# Patient Record
Sex: Male | Born: 1960 | Race: Black or African American | Hispanic: No | Marital: Single | Smoking: Former smoker
Health system: Southern US, Community
[De-identification: ages and names within clinical notes are randomized; demographics above are authoritative.]

## PROBLEM LIST (undated history)

## (undated) DIAGNOSIS — R42 Dizziness and giddiness: Secondary | ICD-10-CM

## (undated) DIAGNOSIS — R011 Cardiac murmur, unspecified: Secondary | ICD-10-CM

## (undated) DIAGNOSIS — Z972 Presence of dental prosthetic device (complete) (partial): Secondary | ICD-10-CM

## (undated) DIAGNOSIS — F259 Schizoaffective disorder, unspecified: Secondary | ICD-10-CM

## (undated) DIAGNOSIS — R634 Abnormal weight loss: Secondary | ICD-10-CM

## (undated) DIAGNOSIS — F419 Anxiety disorder, unspecified: Secondary | ICD-10-CM

## (undated) DIAGNOSIS — Z973 Presence of spectacles and contact lenses: Secondary | ICD-10-CM

## (undated) DIAGNOSIS — R188 Other ascites: Secondary | ICD-10-CM

## (undated) DIAGNOSIS — K08109 Complete loss of teeth, unspecified cause, unspecified class: Secondary | ICD-10-CM

## (undated) HISTORY — PX: BACK SURGERY: SHX140

## (undated) HISTORY — PX: NECK SURGERY: SHX720

## (undated) HISTORY — DX: Cardiac murmur, unspecified: R01.1

## (undated) HISTORY — PX: TONSILLECTOMY: SUR1361

---

## 1980-05-17 HISTORY — PX: THROAT SURGERY: SHX803

## 2008-01-25 ENCOUNTER — Emergency Department: Payer: Self-pay | Admitting: Emergency Medicine

## 2008-01-27 ENCOUNTER — Emergency Department: Payer: Self-pay | Admitting: Emergency Medicine

## 2010-11-16 ENCOUNTER — Observation Stay: Payer: Self-pay | Admitting: Internal Medicine

## 2010-11-17 ENCOUNTER — Inpatient Hospital Stay: Payer: Self-pay | Admitting: Unknown Physician Specialty

## 2010-11-17 DIAGNOSIS — R079 Chest pain, unspecified: Secondary | ICD-10-CM

## 2013-09-25 ENCOUNTER — Encounter (HOSPITAL_BASED_OUTPATIENT_CLINIC_OR_DEPARTMENT_OTHER): Payer: Self-pay | Admitting: Emergency Medicine

## 2013-09-25 ENCOUNTER — Emergency Department (HOSPITAL_BASED_OUTPATIENT_CLINIC_OR_DEPARTMENT_OTHER)
Admission: EM | Admit: 2013-09-25 | Discharge: 2013-09-25 | Disposition: A | Discharge status: Home / Self Care | Payer: Medicare Other | Attending: Emergency Medicine | Admitting: Emergency Medicine

## 2013-09-25 DIAGNOSIS — Z79899 Other long term (current) drug therapy: Secondary | ICD-10-CM | POA: Insufficient documentation

## 2013-09-25 DIAGNOSIS — F17200 Nicotine dependence, unspecified, uncomplicated: Secondary | ICD-10-CM | POA: Insufficient documentation

## 2013-09-25 DIAGNOSIS — K4090 Unilateral inguinal hernia, without obstruction or gangrene, not specified as recurrent: Secondary | ICD-10-CM

## 2013-09-25 DIAGNOSIS — F259 Schizoaffective disorder, unspecified: Secondary | ICD-10-CM | POA: Insufficient documentation

## 2013-09-25 HISTORY — DX: Schizoaffective disorder, unspecified: F25.9

## 2013-09-25 HISTORY — DX: Dizziness and giddiness: R42

## 2013-09-25 HISTORY — DX: Anxiety disorder, unspecified: F41.9

## 2013-09-25 MED ORDER — HYDROCODONE-ACETAMINOPHEN 5-325 MG PO TABS: 2.0000 | ORAL_TABLET | ORAL | Status: DC | PRN

## 2013-09-25 NOTE — ED Provider Notes (Signed)
CSN: [id number]     Arrival date & time 09/25/13  1535 History   First MD Initiated Contact with Patient 09/25/13 1601     Chief Complaint  Patient presents with  . Abdominal Pain     (Consider location/radiation/quality/duration/timing/severity/associated sxs/prior Treatment) Patient is a 53 y.o. male presenting with abdominal pain. The history is provided by the patient. No language interpreter was used.  Abdominal Pain Pain location:  LLQ Pain quality: aching   Pain radiates to:  LLQ Pain severity:  Moderate Onset quality:  Unable to specify Duration:  1 day Timing:  Constant Progression:  Worsening Chronicity:  New Relieved by:  Nothing Worsened by:  Nothing tried Ineffective treatments:  None tried Associated symptoms: no nausea   Risk factors: has not had multiple surgeries     Past Medical History  Diagnosis Date  . Vertigo   . Schizoaffective disorder   . Anxiety    History reviewed. No pertinent past surgical history. History reviewed. No pertinent family history. History  Substance Use Topics  . Smoking status: Current Every Day Smoker -- 0.30 packs/day    Types: Cigarettes  . Smokeless tobacco: Not on file  . Alcohol Use: Yes     Comment: socially    Review of Systems  Gastrointestinal: Positive for abdominal pain. Negative for nausea.  All other systems reviewed and are negative.     Allergies  Review of patient's allergies indicates no known allergies.  Home Medications   Prior to Admission medications   Medication Sig Start Date End Date Taking? Authorizing Provider  ARIPiprazole (ABILIFY) 10 MG tablet Take 10 mg by mouth daily.   Yes Historical Provider, MD  benztropine (COGENTIN) 2 MG tablet Take 2 mg by mouth 2 (two) times daily.   Yes Historical Provider, MD  divalproex (DEPAKOTE) 500 MG DR tablet Take 500 mg by mouth 2 (two) times daily.   Yes Historical Provider, MD   BP 129/85  Pulse 58  Temp(Src) 98.9 F (37.2 C) (Oral)  Resp 18   Ht 6' (1.829 m)  Wt 150 lb (68.04 kg)  BMI 20.34 kg/m2  SpO2 100% Physical Exam  Nursing note and vitals reviewed. Constitutional: He is oriented to person, place, and time. He appears well-developed and well-nourished.  HENT:  Head: Normocephalic.  Eyes: EOM are normal.  Neck: Normal range of motion.  Pulmonary/Chest: Effort normal.  Abdominal: He exhibits no distension.  Hernia left inguinal area,  Reduces easily,   Musculoskeletal: Normal range of motion.  Neurological: He is alert and oriented to person, place, and time.  Psychiatric: He has a normal mood and affect.    ED Course  Procedures (including critical care time) Labs Review Labs Reviewed  URINALYSIS, ROUTINE W REFLEX MICROSCOPIC    Imaging Review No results found.   EKG Interpretation None      MDM   Final diagnoses:  Hernia, inguinal, left    Dr. Lynelle DoctorKnapp in to see and examine.  [facility] referred [facility]    Benjamin AreasLeslie K Sofia, PA-C 09/25/13 1650

## 2013-09-25 NOTE — ED Provider Notes (Signed)
Medical screening examination/treatment/procedure(s) were conducted as a shared visit with non-physician practitioner(s) and myself.  I personally evaluated the patient during the encounter.  Left inguinal hernia.  Easily reduced.  No sign of incarceration.    Kourtney Terriquez R Dayrin Stallone,Celene Kras MD 09/25/13 [phone]

## 2013-09-25 NOTE — ED Notes (Signed)
Pt reports LLQ pain x 1 week. Deneis fever, N/V.

## 2013-09-25 NOTE — Discharge Instructions (Signed)
Hernia A hernia occurs when an internal organ pushes out through a weak spot in the abdominal wall. Hernias most commonly occur in the groin and around the navel. Hernias often can be pushed back into place (reduced). Most hernias tend to get worse over time. Some abdominal hernias can get stuck in the opening (irreducible or incarcerated hernia) and cannot be reduced. An irreducible abdominal hernia which is tightly squeezed into the opening is at risk for impaired blood supply (strangulated hernia). A strangulated hernia is a medical emergency. Because of the risk for an irreducible or strangulated hernia, surgery may be recommended to repair a hernia. CAUSES   Heavy lifting.  Prolonged coughing.  Straining to have a bowel movement.  A cut (incision) made during an abdominal surgery. HOME CARE INSTRUCTIONS   Bed rest is not required. You may continue your normal activities.  Avoid lifting more than 10 pounds (4.5 kg) or straining.  Cough gently. If you are a smoker it is best to stop. Even the best hernia repair can break down with the continual strain of coughing. Even if you do not have your hernia repaired, a cough will continue to aggravate the problem.  Do not wear anything tight over your hernia. Do not try to keep it in with an outside bandage or truss. These can damage abdominal contents if they are trapped within the hernia sac.  Eat a normal diet.  Avoid constipation. Straining over long periods of time will increase hernia size and encourage breakdown of repairs. If you cannot do this with diet alone, stool softeners may be used. SEEK IMMEDIATE MEDICAL CARE IF:   You have a fever.  You develop increasing abdominal pain.  You feel nauseous or vomit.  Your hernia is stuck outside the abdomen, looks discolored, feels hard, or is tender.  You have any changes in your bowel habits or in the hernia that are unusual for you.  You have increased pain or swelling around the  hernia.  You cannot push the hernia back in place by applying gentle pressure while lying down. MAKE SURE YOU:   Understand these instructions.  Will watch your condition.  Will get help right away if you are not doing well or get worse. Document Released: 05/03/2005 Document Revised: 07/26/2011 Document Reviewed: 12/21/2007 ExitCare Patient Information 2014 [employer].  

## 2013-10-09 ENCOUNTER — Ambulatory Visit (INDEPENDENT_AMBULATORY_CARE_PROVIDER_SITE_OTHER): Payer: Medicare Other | Admitting: Surgery

## 2013-10-09 ENCOUNTER — Encounter (INDEPENDENT_AMBULATORY_CARE_PROVIDER_SITE_OTHER): Payer: Self-pay | Admitting: Surgery

## 2013-10-09 VITALS — BP 122/80 | HR 70 | Temp 97.1°F | Ht 72.0 in | Wt 133.0 lb

## 2013-10-09 DIAGNOSIS — R599 Enlarged lymph nodes, unspecified: Secondary | ICD-10-CM

## 2013-10-09 DIAGNOSIS — R109 Unspecified abdominal pain: Secondary | ICD-10-CM

## 2013-10-09 DIAGNOSIS — R634 Abnormal weight loss: Secondary | ICD-10-CM

## 2013-10-09 DIAGNOSIS — K4090 Unilateral inguinal hernia, without obstruction or gangrene, not specified as recurrent: Secondary | ICD-10-CM

## 2013-10-09 DIAGNOSIS — R590 Localized enlarged lymph nodes: Secondary | ICD-10-CM | POA: Insufficient documentation

## 2013-10-09 MED ORDER — HYDROCODONE-ACETAMINOPHEN 5-325 MG PO TABS: 2.0000 | ORAL_TABLET | ORAL | Status: DC | PRN

## 2013-10-09 NOTE — Patient Instructions (Signed)
WILL SET UP TESTS TO EVALUATE WEIGHT LOSS AND ABDOMINAL PAIN AND ENLARGED LYMPH NODES.      Inguinal Hernia, Adult Muscles help keep everything in the body in its proper place. But if a weak spot in the muscles develops, something can poke through. That is called a hernia. When this happens in the lower part of the belly (abdomen), it is called an inguinal hernia. (It takes its name from a part of the body in this region called the inguinal canal.) A weak spot in the wall of muscles lets some fat or part of the small intestine bulge through. An inguinal hernia can develop at any age. Men get them more often than women. CAUSES  In adults, an inguinal hernia develops over time.  It can be triggered by:  Suddenly straining the muscles of the lower abdomen.  Lifting heavy objects.  Straining to have a bowel movement. Difficult bowel movements (constipation) can lead to this.  Constant coughing. This may be caused by smoking or lung disease.  Being overweight.  Being pregnant.  Working at a job that requires long periods of standing or heavy lifting.  Having had an inguinal hernia before. One type can be an emergency situation. It is called a strangulated inguinal hernia. It develops if part of the small intestine slips through the weak spot and cannot get back into the abdomen. The blood supply can be cut off. If that happens, part of the intestine may die. This situation requires emergency surgery. SYMPTOMS  Often, a small inguinal hernia has no symptoms. It is found when a healthcare provider does a physical exam. Larger hernias usually have symptoms.   In adults, symptoms may include:  A lump in the groin. This is easier to see when the person is standing. It might disappear when lying down.  In men, a lump in the scrotum.  Pain or burning in the groin. This occurs especially when lifting, straining or coughing.  A dull ache or feeling of pressure in the groin.  Signs of a  strangulated hernia can include:  A bulge in the groin that becomes very painful and tender to the touch.  A bulge that turns red or purple.  Fever, nausea and vomiting.  Inability to have a bowel movement or to pass gas. DIAGNOSIS  To decide if you have an inguinal hernia, a healthcare provider will probably do a physical examination.  This will include asking questions about any symptoms you have noticed.  The healthcare provider might feel the groin area and ask you to cough. If an inguinal hernia is felt, the healthcare provider may try to slide it back into the abdomen.  Usually no other tests are needed. TREATMENT  Treatments can vary. The size of the hernia makes a difference. Options include:  Watchful waiting. This is often suggested if the hernia is small and you have had no symptoms.  No medical procedure will be done unless symptoms develop.  You will need to watch closely for symptoms. If any occur, contact your healthcare provider right away.  Surgery. This is used if the hernia is larger or you have symptoms.  Open surgery. This is usually an outpatient procedure (you will not stay overnight in a hospital). An cut (incision) is made through the skin in the groin. The hernia is put back inside the abdomen. The weak area in the muscles is then repaired by herniorrhaphy or hernioplasty. Herniorrhaphy: in this type of surgery, the weak muscles are sewn  back together. Hernioplasty: a patch or mesh is used to close the weak area in the abdominal wall.  Laparoscopy. In this procedure, a surgeon makes small incisions. A thin tube with a tiny video camera (called a laparoscope) is put into the abdomen. The surgeon repairs the hernia with mesh by looking with the video camera and using two long instruments. HOME CARE INSTRUCTIONS   After surgery to repair an inguinal hernia:  You will need to take pain medicine prescribed by your healthcare provider. Follow all directions  carefully.  You will need to take care of the wound from the incision.  Your activity will be restricted for awhile. This will probably include no heavy lifting for several weeks. You also should not do anything too active for a few weeks. When you can return to work will depend on the type of job that you have.  During "watchful waiting" periods, you should:  Maintain a healthy weight.  Eat a diet high in fiber (fruits, vegetables and whole grains).  Drink plenty of fluids to avoid constipation. This means drinking enough water and other liquids to keep your urine clear or pale yellow.  Do not lift heavy objects.  Do not stand for long periods of time.  Quit smoking. This should keep you from developing a frequent cough. SEEK MEDICAL CARE IF:   A bulge develops in your groin area.  You feel pain, a burning sensation or pressure in the groin. This might be worse if you are lifting or straining.  You develop a fever of more than 100.5 F (38.1 C). SEEK IMMEDIATE MEDICAL CARE IF:   Pain in the groin increases suddenly.  A bulge in the groin gets bigger suddenly and does not go down.  For men, there is sudden pain in the scrotum. Or, the size of the scrotum increases.  A bulge in the groin area becomes red or purple and is painful to touch.  You have nausea or vomiting that does not go away.  You feel your heart beating much faster than normal.  You cannot have a bowel movement or pass gas.  You develop a fever of more than 102.0 F (38.9 C). Document Released: 09/19/2008 Document Revised: 07/26/2011 Document Reviewed: 09/19/2008 [facility] Patient Information 2014 [place], [place].

## 2013-10-09 NOTE — Progress Notes (Signed)
Patient ID: Benjamin Salazar, male   DOB: 20-Jan-1961, 53 y.o.   MRN: [id number]  Chief Complaint  Patient presents with  . eval lih    HPI Benjamin Salazar is a 53 y.o. male.   HPI The patient presents with chief complaint of left inguinal hernia, weight loss of 20 pounds over last month, and mas under right jaw. The patient was seen recently in the emergency room and referred for small left inguinal hernia. He complains of diffuse abdominal pain and fullness. He did having some issue with his bowel movements. He has some constipation and irritability. He has no primary care doctor. He is on anti seizure medication but apparently not followed by any one closely. He states he has a knot under his right jaw. He is a heavy smoker. He is not consistently seeing a doctor. Denies night sweats. Has global pain in upper and lower extremities as well abdomen and neck.   Past Medical History  Diagnosis Date  . Vertigo   . Schizoaffective disorder   . Anxiety   . Heart murmur     History reviewed. No pertinent past surgical history.  History reviewed. No pertinent family history.  Social History History  Substance Use Topics  . Smoking status: Current Every Day Smoker -- 0.30 packs/day    Types: Cigarettes  . Smokeless tobacco: Not on file  . Alcohol Use: Yes     Comment: socially    No Known Allergies  Current Outpatient Prescriptions  Medication Sig Dispense Refill  . ARIPiprazole (ABILIFY) 10 MG tablet Take 10 mg by mouth daily.      . benztropine (COGENTIN) 2 MG tablet Take 2 mg by mouth 2 (two) times daily.      . divalproex (DEPAKOTE) 500 MG DR tablet Take 500 mg by mouth 2 (two) times daily.      Marland Kitchen HYDROcodone-acetaminophen (NORCO/VICODIN) 5-325 MG per tablet Take 2 tablets by mouth every 4 (four) hours as needed.  20 tablet  0   No current facility-administered medications for this visit.    Review of Systems Review of Systems  Constitutional: Positive for fever,  fatigue and unexpected weight change.  HENT: Negative.   Eyes: Negative.   Respiratory: Negative for cough.   Gastrointestinal: Positive for abdominal pain, constipation and abdominal distention. Negative for vomiting.  Genitourinary: Positive for testicular pain.  Musculoskeletal: Positive for arthralgias, back pain, myalgias and neck pain.  Neurological: Positive for seizures and weakness.  Hematological: Positive for adenopathy.  Psychiatric/Behavioral: The patient is nervous/anxious.     Blood pressure 122/80, pulse 70, temperature 97.1 F (36.2 C), height 6' (1.829 m), weight 133 lb (60.328 kg).  Physical Exam Physical Exam  Constitutional: He is oriented to person, place, and time. He has a sickly appearance.  HENT:  Head: Normocephalic and atraumatic.  Eyes: Pupils are equal, round, and reactive to light. No scleral icterus.  Neck: Normal range of motion. No thyromegaly present.  Cardiovascular: Normal rate and regular rhythm.   Pulmonary/Chest: Effort normal and breath sounds normal.  Abdominal: Soft. He exhibits distension and ascites. He exhibits no mass. There is no hepatosplenomegaly. There is generalized tenderness. There is no rigidity, no rebound, no guarding and no tenderness at McBurney's point. A hernia is present. Hernia confirmed positive in the left inguinal area.    Lymphadenopathy:       Head (right side): Submental adenopathy present.    He has cervical adenopathy.    He has axillary adenopathy.  Right axillary: Lateral adenopathy present.       Left axillary: Lateral adenopathy present.       Right: Inguinal adenopathy present.       Left: Inguinal adenopathy present.  Neurological: He is alert and oriented to person, place, and time.  Skin: Skin is warm and dry.  Psychiatric: He has a normal mood and affect. His behavior is normal. Thought content normal.    Data Reviewed ED note  Assessment    Small left reducible inguinal hernia  Right  cervical lymphadenopathy  Abdominal pain generalized nonspecific  Bilateral axillary lymphadenopathy  Unexplained weight loss  Tobacco abuse    Plan    Patient has significant weight loss, lymphadenopathy and overall CT appearance. Left inguinal hernia is not responsible for this.Given patient's extensive tobacco history, recommend cervical CT, chest x-ray, abdominal pelvic CT, CBC, C. Met and LDH level. His workup otherwise negative, recommend repair of left inguinal hernia. Recommend tobacco cessation.       Maxim Bedel A. Kharma Sampsel 10/09/2013, 3:57 PM

## 2013-10-10 ENCOUNTER — Telehealth (INDEPENDENT_AMBULATORY_CARE_PROVIDER_SITE_OTHER): Payer: Self-pay | Admitting: *Deleted

## 2013-10-10 NOTE — Telephone Encounter (Signed)
LM for pt to return my call regarding his CT.  It is scheduled for 10/11/13, [facility], [address].  Arrive at 8:15 a.m.   Drink 1st bottle of contrast 6:30 a.m. And 2nd bottle 7:30 a.m. NO solid foods 4 hours prior to test.  Thanks!  Victorino Dike

## 2013-10-10 NOTE — Telephone Encounter (Signed)
Patient  Aware of CT Date /time ,NPO, drink times, Gave # [phone] if further questions

## 2013-10-11 ENCOUNTER — Ambulatory Visit
Admission: RE | Admit: 2013-10-11 | Discharge: 2013-10-11 | Disposition: A | Discharge status: Home / Self Care | Payer: Medicare Other | Source: Ambulatory Visit | Attending: Surgery | Admitting: Surgery

## 2013-10-11 DIAGNOSIS — R109 Unspecified abdominal pain: Secondary | ICD-10-CM

## 2013-10-11 MED ORDER — IOHEXOL 300 MG/ML  SOLN
100.0000 mL | Freq: Once | INTRAMUSCULAR | Status: AC | PRN
  Administered 2013-10-11: 100 mL via INTRAVENOUS

## 2013-10-17 ENCOUNTER — Telehealth (INDEPENDENT_AMBULATORY_CARE_PROVIDER_SITE_OTHER): Payer: Self-pay

## 2013-10-17 ENCOUNTER — Other Ambulatory Visit (INDEPENDENT_AMBULATORY_CARE_PROVIDER_SITE_OTHER): Payer: Self-pay

## 2013-10-17 ENCOUNTER — Other Ambulatory Visit (INDEPENDENT_AMBULATORY_CARE_PROVIDER_SITE_OTHER): Payer: Self-pay | Admitting: Surgery

## 2013-10-17 DIAGNOSIS — K869 Disease of pancreas, unspecified: Secondary | ICD-10-CM

## 2013-10-17 DIAGNOSIS — Z139 Encounter for screening, unspecified: Secondary | ICD-10-CM

## 2013-10-17 NOTE — Telephone Encounter (Signed)
Message copied by Brennan Bailey on Wed Oct 17, 2013  1:29 PM ------      Message from: Harriette Bouillon A      Created: Fri Oct 12, 2013  9:35 AM       Needs pancreatic MRI.  If ok,  Will set up hernia surgery. ------

## 2013-10-17 NOTE — Telephone Encounter (Signed)
Called pt with results. We will call him this afternoon with MRI appt,

## 2013-10-18 ENCOUNTER — Ambulatory Visit
Admission: RE | Admit: 2013-10-18 | Discharge: 2013-10-18 | Disposition: A | Discharge status: Home / Self Care | Payer: Medicare Other | Source: Ambulatory Visit | Attending: Surgery | Admitting: Surgery

## 2013-10-18 DIAGNOSIS — Z139 Encounter for screening, unspecified: Secondary | ICD-10-CM

## 2013-10-22 ENCOUNTER — Ambulatory Visit
Admission: RE | Admit: 2013-10-22 | Discharge: 2013-10-22 | Disposition: A | Discharge status: Home / Self Care | Payer: Medicare Other | Source: Ambulatory Visit | Attending: Surgery | Admitting: Surgery

## 2013-10-22 DIAGNOSIS — K869 Disease of pancreas, unspecified: Secondary | ICD-10-CM

## 2013-10-22 MED ORDER — GADOBENATE DIMEGLUMINE 529 MG/ML IV SOLN
12.0000 mL | Freq: Once | INTRAVENOUS | Status: AC | PRN
  Administered 2013-10-22: 12 mL via INTRAVENOUS

## 2013-10-23 ENCOUNTER — Telehealth (INDEPENDENT_AMBULATORY_CARE_PROVIDER_SITE_OTHER): Payer: Self-pay | Admitting: *Deleted

## 2013-10-23 NOTE — Telephone Encounter (Signed)
Dr. Janae Sauce, Psychiatrist in [facility], called this morning and would like to speak with you regarding pt.  She advised that it was not urgent, but when you have some free time to please call her at [phone].  Thanks!  Victorino Dike

## 2013-10-25 LAB — COMPREHENSIVE METABOLIC PANEL
ALK PHOS: 53 U/L (ref 39–117)
ALT: 16 U/L (ref 0–53)
AST: 20 U/L (ref 0–37)
Albumin: 4.8 g/dL (ref 3.5–5.2)
BUN: 9 mg/dL (ref 6–23)
CALCIUM: 10.1 mg/dL (ref 8.4–10.5)
CHLORIDE: 105 meq/L (ref 96–112)
CO2: 24 meq/L (ref 19–32)
Creat: 1.04 mg/dL (ref 0.50–1.35)
GLUCOSE: 111 mg/dL — AB (ref 70–99)
POTASSIUM: 4.3 meq/L (ref 3.5–5.3)
Sodium: 142 mEq/L (ref 135–145)
TOTAL PROTEIN: 8 g/dL (ref 6.0–8.3)
Total Bilirubin: 0.5 mg/dL (ref 0.2–1.2)

## 2013-10-25 LAB — LACTATE DEHYDROGENASE: LDH: 160 U/L (ref 94–250)

## 2013-10-29 ENCOUNTER — Encounter (INDEPENDENT_AMBULATORY_CARE_PROVIDER_SITE_OTHER): Payer: Self-pay | Admitting: Surgery

## 2013-10-29 ENCOUNTER — Ambulatory Visit (INDEPENDENT_AMBULATORY_CARE_PROVIDER_SITE_OTHER): Payer: Medicare Other | Admitting: Surgery

## 2013-10-29 VITALS — BP 114/70 | HR 60 | Resp 12 | Ht 72.0 in | Wt 134.0 lb

## 2013-10-29 DIAGNOSIS — K4090 Unilateral inguinal hernia, without obstruction or gangrene, not specified as recurrent: Secondary | ICD-10-CM

## 2013-10-29 MED ORDER — HYDROCODONE-ACETAMINOPHEN 5-325 MG PO TABS: 1.0000 | ORAL_TABLET | ORAL | Status: DC | PRN

## 2013-10-29 NOTE — Progress Notes (Signed)
Subjective:     Patient ID: Benjamin Salazar, male   DOB: 07-26-1960, 53 y.o.   MRN: [id number]  HPI Patient returns in follow up secondary to abdominal pain, small painful left inguinal hernia, weight loss and lymphadenopathy. CT scanning of the neck revealed no lymphadenopathy. CT abdomen and pelvis showed a possible pancreatic mass that was imaged by MRI and found to be normal. No explanation for weight loss, abdominal pain or fatigue found. Of note, left inguinal hernia not well visualized. No pelvic adenopathy either. Here today to set up surgery for repair of symptomatic left anal hernia.  Review of Systems  Constitutional: Positive for fatigue.  Gastrointestinal: Positive for abdominal pain.  Genitourinary: Negative.        Objective:   Physical Exam  Constitutional: He is oriented to person, place, and time.  HENT:  Head: Normocephalic.  Eyes: No scleral icterus.  Abdominal: A hernia is present. Hernia confirmed positive in the left inguinal area.  Neurological: He is alert and oriented to person, place, and time.  Psychiatric: He has a normal mood and affect. His behavior is normal. Judgment and thought content normal.       Assessment:     Left inguinal  hernia symptomatic  Weight loss unclear etiology  Tobacco abuse      Plan:     Given negative workup for malignancy, will proceed with left inguinal hernia with mesh.The risk of hernia repair include bleeding,  Infection,   Recurrence of the hernia,  Mesh use, chronic pain,  Organ injury,  Bowel injury,  Bladder injury,   nerve injury with numbness around the incision,  Death,  and worsening of preexisting  medical problems.  The alternatives to surgery have been discussed as well..  Long term expectations of both operative and non operative treatments have been discussed.   The patient agrees to proceed.

## 2013-10-29 NOTE — Patient Instructions (Signed)
Open Hernia Repair Open hernia repair is surgery to fix a hernia. A hernia occurs when an internal organ or tissue pushes out through a weak spot in the abdominal wall muscles. Hernias commonly occur in the groin and around the navel. Most hernias tend to get worse over time. Surgery is often done to prevent the hernia from getting bigger, becoming uncomfortable, or becoming an emergency. Emergency surgery may be needed if abdominal contents get stuck in the opening (incarcerated hernia) or the blood supply gets cut off (strangulated hernia). In an open repair, a large cut (incision) is made in the abdomen to perform the surgery. LET YOUR HEALTH CARE PROVIDER KNOW ABOUT:  Any allergies you have.  All medicines you are taking, including vitamins, herbs, eye drops, creams, and over-the-counter medicines.  Previous problems you or members of your family have had with the use of anesthetics.  Any blood disorders you have.  Previous surgeries you have had.  Medical conditions you have. RISKS AND COMPLICATIONS Generally, this is a safe procedure. However, as with any procedure, complications can occur. Possible complications include:  Infection.  Bleeding.  Nerve injury.  Chronic pain.  The hernia can come back.  Injury to the intestines. BEFORE THE PROCEDURE  Ask your health care provider about changing or stopping any regular medicines. Avoid taking aspirin or blood thinners as directed by your health care provider.  Do noteat or drink anything after midnight the night before surgery.  If you smoke, do not smoke for at least 2 weeks before your surgery.  Do not drink alcohol the day before your surgery.  Let your health care provider know if you develop a cold or any infection before your surgery.  Arrange for someone to drive you home after the procedure or after your hospital stay. Also arrange for someone to help you with activities during recovery. PROCEDURE   Small  monitors will be put on your body. They are used to check your heart, blood pressure, and oxygen level.   An IV access tube will be put into one of your veins. Medicine will be able to flow directly into your body through this IV tube.   You might be given a medicine to help you relax (sedative).   You will be given a medicine to make you sleep (general anesthetic). A breathing tube may be placed into your lungs during the procedure.  A cut (incision) is made over the hernia defect, and the contents are pushed back into the abdomen.  If the hernia is small, stitches may be used to bring the muscle edges back together.  Typically, a surgeon will place a mesh patch made of man-made material (synthetic) to cover the defect. The mesh is sewn to healthy muscle. This reduces the risk of the hernia coming back.  The tissue and skin over the hernia are then closed with stitches or staples.  If the hernia was large, a drain may be left in place to collect excess fluid where the hernia used to be.  Bandages (dressings) are used to cover the incision. AFTER THE PROCEDURE  You will be taken to a recovery area where your progress will be monitored.  If the hernia was small or in the groin (inguinal) region, you will likely be allowed to go home once you are awake, stable, and taking fluids well.  If the hernia was large, you may have to wait for your bowel function to return. You may need to stay in the hospital   for 2 3 days until you can eat and your pain is controlled. A drain may be left in place for 5 7 days. You will be taught how to care for the drain. Document Released: 10/27/2000 Document Revised: 02/21/2013 Document Reviewed: 12/13/2012 Exit[employer] Patient Information 2014 [employer].  

## 2013-11-12 ENCOUNTER — Other Ambulatory Visit (INDEPENDENT_AMBULATORY_CARE_PROVIDER_SITE_OTHER): Payer: Self-pay | Admitting: Surgery

## 2013-11-12 ENCOUNTER — Encounter (HOSPITAL_BASED_OUTPATIENT_CLINIC_OR_DEPARTMENT_OTHER): Payer: Self-pay | Admitting: *Deleted

## 2013-11-12 NOTE — Progress Notes (Signed)
Pt had xrays and labs to r/o cancer-wt loss States NO History seizures- Take all meds, and bring

## 2013-11-15 ENCOUNTER — Encounter (HOSPITAL_BASED_OUTPATIENT_CLINIC_OR_DEPARTMENT_OTHER): Payer: Medicare Other | Admitting: Anesthesiology

## 2013-11-15 ENCOUNTER — Encounter (HOSPITAL_BASED_OUTPATIENT_CLINIC_OR_DEPARTMENT_OTHER): Payer: Self-pay | Admitting: *Deleted

## 2013-11-15 ENCOUNTER — Encounter (HOSPITAL_BASED_OUTPATIENT_CLINIC_OR_DEPARTMENT_OTHER)
Admission: RE | Disposition: A | Discharge status: Home / Self Care | Payer: Self-pay | Source: Ambulatory Visit | Attending: Surgery

## 2013-11-15 ENCOUNTER — Ambulatory Visit (HOSPITAL_BASED_OUTPATIENT_CLINIC_OR_DEPARTMENT_OTHER)
Admission: RE | Admit: 2013-11-15 | Discharge: 2013-11-15 | Disposition: A | Discharge status: Home / Self Care | Payer: Medicare Other | Source: Ambulatory Visit | Attending: Surgery | Admitting: Surgery

## 2013-11-15 ENCOUNTER — Ambulatory Visit (HOSPITAL_BASED_OUTPATIENT_CLINIC_OR_DEPARTMENT_OTHER): Payer: Medicare Other | Admitting: Anesthesiology

## 2013-11-15 DIAGNOSIS — R590 Localized enlarged lymph nodes: Secondary | ICD-10-CM

## 2013-11-15 DIAGNOSIS — F17200 Nicotine dependence, unspecified, uncomplicated: Secondary | ICD-10-CM | POA: Insufficient documentation

## 2013-11-15 DIAGNOSIS — K4090 Unilateral inguinal hernia, without obstruction or gangrene, not specified as recurrent: Secondary | ICD-10-CM

## 2013-11-15 DIAGNOSIS — R634 Abnormal weight loss: Secondary | ICD-10-CM | POA: Insufficient documentation

## 2013-11-15 DIAGNOSIS — R109 Unspecified abdominal pain: Secondary | ICD-10-CM

## 2013-11-15 HISTORY — DX: Presence of spectacles and contact lenses: Z97.3

## 2013-11-15 HISTORY — PX: INSERTION OF MESH: SHX5868

## 2013-11-15 HISTORY — DX: Other ascites: R18.8

## 2013-11-15 HISTORY — DX: Presence of dental prosthetic device (complete) (partial): Z97.2

## 2013-11-15 HISTORY — PX: INGUINAL HERNIA REPAIR: SHX194

## 2013-11-15 HISTORY — DX: Abnormal weight loss: R63.4

## 2013-11-15 HISTORY — DX: Complete loss of teeth, unspecified cause, unspecified class: K08.109

## 2013-11-15 SURGERY — REPAIR, HERNIA, INGUINAL, ADULT
Anesthesia: General | Site: Groin | Laterality: Left

## 2013-11-15 MED ORDER — BUPIVACAINE-EPINEPHRINE (PF) 0.5% -1:200000 IJ SOLN
INTRAMUSCULAR | Status: DC | PRN
  Administered 2013-11-15: 20 mL via PERINEURAL

## 2013-11-15 MED ORDER — FENTANYL CITRATE 0.05 MG/ML IJ SOLN
50.0000 ug | INTRAMUSCULAR | Status: DC | PRN
  Administered 2013-11-15 (×2): 100 ug via INTRAVENOUS

## 2013-11-15 MED ORDER — FENTANYL CITRATE 0.05 MG/ML IJ SOLN
INTRAMUSCULAR | Status: AC
  Filled 2013-11-15: qty 2

## 2013-11-15 MED ORDER — FENTANYL CITRATE 0.05 MG/ML IJ SOLN
INTRAMUSCULAR | Status: DC | PRN
  Administered 2013-11-15: 100 ug via INTRAVENOUS

## 2013-11-15 MED ORDER — BUPIVACAINE-EPINEPHRINE 0.25% -1:200000 IJ SOLN
INTRAMUSCULAR | Status: DC | PRN
  Administered 2013-11-15: 5 mL

## 2013-11-15 MED ORDER — ONDANSETRON HCL 4 MG/2ML IJ SOLN
INTRAMUSCULAR | Status: DC | PRN
  Administered 2013-11-15: 4 mg via INTRAVENOUS

## 2013-11-15 MED ORDER — PROPOFOL 10 MG/ML IV BOLUS
INTRAVENOUS | Status: AC
  Filled 2013-11-15: qty 20

## 2013-11-15 MED ORDER — CEFAZOLIN SODIUM-DEXTROSE 2-3 GM-% IV SOLR
2.0000 g | INTRAVENOUS | Status: AC
  Administered 2013-11-15: 2 g via INTRAVENOUS

## 2013-11-15 MED ORDER — LIDOCAINE HCL (CARDIAC) 20 MG/ML IV SOLN
INTRAVENOUS | Status: DC | PRN
  Administered 2013-11-15: 80 mg via INTRAVENOUS

## 2013-11-15 MED ORDER — LACTATED RINGERS IV SOLN
INTRAVENOUS | Status: DC
  Administered 2013-11-15 (×2): via INTRAVENOUS

## 2013-11-15 MED ORDER — FENTANYL CITRATE 0.05 MG/ML IJ SOLN
INTRAMUSCULAR | Status: AC
  Filled 2013-11-15: qty 4

## 2013-11-15 MED ORDER — OXYCODONE HCL 5 MG/5ML PO SOLN
ORAL | Status: AC
  Filled 2013-11-15: qty 5

## 2013-11-15 MED ORDER — OXYCODONE HCL 5 MG PO TABS: 5.0000 mg | ORAL_TABLET | Freq: Once | ORAL | Status: AC | PRN

## 2013-11-15 MED ORDER — BUPIVACAINE-EPINEPHRINE (PF) 0.25% -1:200000 IJ SOLN
INTRAMUSCULAR | Status: AC
  Filled 2013-11-15: qty 30

## 2013-11-15 MED ORDER — OXYCODONE HCL 5 MG/5ML PO SOLN
5.0000 mg | Freq: Once | ORAL | Status: AC | PRN
  Administered 2013-11-15: 5 mg via ORAL

## 2013-11-15 MED ORDER — HYDROMORPHONE HCL PF 1 MG/ML IJ SOLN
0.2500 mg | INTRAMUSCULAR | Status: DC | PRN
  Administered 2013-11-15 (×4): 0.5 mg via INTRAVENOUS

## 2013-11-15 MED ORDER — MIDAZOLAM HCL 2 MG/2ML IJ SOLN
1.0000 mg | INTRAMUSCULAR | Status: DC | PRN
  Administered 2013-11-15: 2 mg via INTRAVENOUS

## 2013-11-15 MED ORDER — ONDANSETRON HCL 4 MG/2ML IJ SOLN: 4.0000 mg | Freq: Once | INTRAMUSCULAR | Status: DC | PRN

## 2013-11-15 MED ORDER — HYDROMORPHONE HCL PF 1 MG/ML IJ SOLN
INTRAMUSCULAR | Status: AC
  Filled 2013-11-15: qty 1

## 2013-11-15 MED ORDER — PROPOFOL 10 MG/ML IV BOLUS
INTRAVENOUS | Status: DC | PRN
  Administered 2013-11-15: 200 mg via INTRAVENOUS

## 2013-11-15 MED ORDER — DEXAMETHASONE SODIUM PHOSPHATE 4 MG/ML IJ SOLN
INTRAMUSCULAR | Status: DC | PRN
  Administered 2013-11-15: 10 mg via INTRAVENOUS

## 2013-11-15 MED ORDER — OXYCODONE-ACETAMINOPHEN 10-325 MG PO TABS
1.0000 | ORAL_TABLET | ORAL | Status: DC | PRN
Start: 2013-11-15 — End: 2013-11-28

## 2013-11-15 MED ORDER — MIDAZOLAM HCL 2 MG/2ML IJ SOLN
INTRAMUSCULAR | Status: AC
  Filled 2013-11-15: qty 2

## 2013-11-15 MED ORDER — CEFAZOLIN SODIUM-DEXTROSE 2-3 GM-% IV SOLR
INTRAVENOUS | Status: AC
  Filled 2013-11-15: qty 50

## 2013-11-15 SURGICAL SUPPLY — 52 items
BLADE SURG 15 STRL LF DISP TIS (BLADE) ×2 IMPLANT
BLADE SURG 15 STRL SS (BLADE) ×2
BLADE SURG ROTATE 9660 (MISCELLANEOUS) ×4 IMPLANT
CANISTER SUCT 1200ML W/VALVE (MISCELLANEOUS) ×4 IMPLANT
CHLORAPREP W/TINT 26ML (MISCELLANEOUS) ×4 IMPLANT
COVER MAYO STAND STRL (DRAPES) ×4 IMPLANT
COVER TABLE BACK 60X90 (DRAPES) ×4 IMPLANT
DECANTER SPIKE VIAL GLASS SM (MISCELLANEOUS) ×4 IMPLANT
DERMABOND ADVANCED (GAUZE/BANDAGES/DRESSINGS) ×2
DERMABOND ADVANCED .7 DNX12 (GAUZE/BANDAGES/DRESSINGS) ×2 IMPLANT
DRAIN PENROSE 1/2X12 LTX STRL (WOUND CARE) ×4 IMPLANT
DRAPE LAPAROTOMY TRNSV 102X78 (DRAPE) ×4 IMPLANT
DRAPE UTILITY XL STRL (DRAPES) ×4 IMPLANT
ELECT COATED BLADE 2.86 ST (ELECTRODE) ×4 IMPLANT
ELECT REM PT RETURN 9FT ADLT (ELECTROSURGICAL) ×4
ELECTRODE REM PT RTRN 9FT ADLT (ELECTROSURGICAL) ×2 IMPLANT
GAUZE SPONGE 4X4 16PLY XRAY LF (GAUZE/BANDAGES/DRESSINGS) IMPLANT
GLOVE BIO SURGEON STRL SZ 6.5 (GLOVE) ×3 IMPLANT
GLOVE BIO SURGEONS STRL SZ 6.5 (GLOVE) ×1
GLOVE BIOGEL PI IND STRL 7.0 (GLOVE) ×2 IMPLANT
GLOVE BIOGEL PI IND STRL 8 (GLOVE) ×2 IMPLANT
GLOVE BIOGEL PI INDICATOR 7.0 (GLOVE) ×2
GLOVE BIOGEL PI INDICATOR 8 (GLOVE) ×2
GLOVE ECLIPSE 8.0 STRL XLNG CF (GLOVE) ×4 IMPLANT
GOWN STRL REUS W/ TWL LRG LVL3 (GOWN DISPOSABLE) ×4 IMPLANT
GOWN STRL REUS W/TWL LRG LVL3 (GOWN DISPOSABLE) ×4
MESH HERNIA SYS ULTRAPRO LRG (Mesh General) ×4 IMPLANT
NEEDLE HYPO 25X1 1.5 SAFETY (NEEDLE) ×4 IMPLANT
NS IRRIG 1000ML POUR BTL (IV SOLUTION) ×4 IMPLANT
PACK BASIN DAY SURGERY FS (CUSTOM PROCEDURE TRAY) ×4 IMPLANT
PENCIL BUTTON HOLSTER BLD 10FT (ELECTRODE) ×4 IMPLANT
SLEEVE SCD COMPRESS KNEE MED (MISCELLANEOUS) ×4 IMPLANT
SPONGE GAUZE 4X4 12PLY STER LF (GAUZE/BANDAGES/DRESSINGS) IMPLANT
SPONGE LAP 4X18 X RAY DECT (DISPOSABLE) ×4 IMPLANT
STAPLER VISISTAT 35W (STAPLE) IMPLANT
SUT MON AB 4-0 PC3 18 (SUTURE) ×4 IMPLANT
SUT NOVA 0 T19/GS 22DT (SUTURE) ×8 IMPLANT
SUT VIC AB 0 SH 27 (SUTURE) ×4 IMPLANT
SUT VIC AB 2-0 SH 27 (SUTURE) ×2
SUT VIC AB 2-0 SH 27XBRD (SUTURE) ×2 IMPLANT
SUT VIC AB 3-0 54X BRD REEL (SUTURE) IMPLANT
SUT VIC AB 3-0 BRD 54 (SUTURE)
SUT VICRYL 3-0 CR8 SH (SUTURE) ×4 IMPLANT
SUT VICRYL AB 2 0 TIE (SUTURE) IMPLANT
SUT VICRYL AB 2 0 TIES (SUTURE)
SYRINGE CONTROL L 12CC (SYRINGE) ×4 IMPLANT
TAPE HYPAFIX 4 X10 (GAUZE/BANDAGES/DRESSINGS) IMPLANT
TOWEL OR 17X24 6PK STRL BLUE (TOWEL DISPOSABLE) ×8 IMPLANT
TOWEL OR NON WOVEN STRL DISP B (DISPOSABLE) ×4 IMPLANT
TUBE CONNECTING 20'X1/4 (TUBING) ×1
TUBE CONNECTING 20X1/4 (TUBING) ×3 IMPLANT
YANKAUER SUCT BULB TIP NO VENT (SUCTIONS) ×4 IMPLANT

## 2013-11-15 NOTE — Anesthesia Preprocedure Evaluation (Signed)
Anesthesia Evaluation  Patient identified by MRN, date of birth, ID band Patient awake    Reviewed: Allergy & Precautions, H&P , NPO status , Patient's Chart, lab work & pertinent test results  Airway Mallampati: I TM Distance: >3 FB Neck ROM: Full    Dental  (+) Teeth Intact, Dental Advisory Given   Pulmonary Current Smoker,  breath sounds clear to auscultation        Cardiovascular Rhythm:Regular Rate:Normal     Neuro/Psych    GI/Hepatic   Endo/Other    Renal/GU      Musculoskeletal   Abdominal   Peds  Hematology   Anesthesia Other Findings   Reproductive/Obstetrics                           Anesthesia Physical Anesthesia Plan  ASA: II  Anesthesia Plan: General   Post-op Pain Management:    Induction: Intravenous  Airway Management Planned: LMA  Additional Equipment:   Intra-op Plan:   Post-operative Plan: Extubation in OR  Informed Consent: I have reviewed the patients History and Physical, chart, labs and discussed the procedure including the risks, benefits and alternatives for the proposed anesthesia with the patient or authorized representative who has indicated his/her understanding and acceptance.   Dental advisory given  Plan Discussed with: CRNA, Anesthesiologist and Surgeon  Anesthesia Plan Comments:         Anesthesia Quick Evaluation  

## 2013-11-15 NOTE — Progress Notes (Signed)
Assisted Dr. Crews with left, ultrasound guided, transabdominal plane block. Side rails up, monitors on throughout procedure. See vital signs in flow sheet. Tolerated Procedure well. 

## 2013-11-15 NOTE — Transfer of Care (Signed)
Immediate Anesthesia Transfer of Care Note  Patient: Benjamin Salazar  Procedure(s) Performed: Procedure(s): REPAIR LEFT INGUINAL HERNIA  (Left) INSERTION OF MESH (Left)  Patient Location: PACU  Anesthesia Type:General  Level of Consciousness: awake and alert   Airway & Oxygen Therapy: Patient Spontanous Breathing and Patient connected to face mask oxygen  Post-op Assessment: Report given to PACU RN and Post -op Vital signs reviewed and stable  Post vital signs: Reviewed and stable  Complications: No apparent anesthesia complications

## 2013-11-15 NOTE — H&P (View-Only) (Signed)
Subjective:     Patient ID: Benjamin Salazar, male   DOB: 06/08/1960, 53 y.o.   MRN: [id number]  HPI Patient returns in follow up secondary to abdominal pain, small painful left inguinal hernia, weight loss and lymphadenopathy. CT scanning of the neck revealed no lymphadenopathy. CT abdomen and pelvis showed a possible pancreatic mass that was imaged by MRI and found to be normal. No explanation for weight loss, abdominal pain or fatigue found. Of note, left inguinal hernia not well visualized. No pelvic adenopathy either. Here today to set up surgery for repair of symptomatic left anal hernia.  Review of Systems  Constitutional: Positive for fatigue.  Gastrointestinal: Positive for abdominal pain.  Genitourinary: Negative.        Objective:   Physical Exam  Constitutional: He is oriented to person, place, and time.  HENT:  Head: Normocephalic.  Eyes: No scleral icterus.  Abdominal: A hernia is present. Hernia confirmed positive in the left inguinal area.  Neurological: He is alert and oriented to person, place, and time.  Psychiatric: He has a normal mood and affect. His behavior is normal. Judgment and thought content normal.       Assessment:     Left inguinal  hernia symptomatic  Weight loss unclear etiology  Tobacco abuse      Plan:     Given negative workup for malignancy, will proceed with left inguinal hernia with mesh.The risk of hernia repair include bleeding,  Infection,   Recurrence of the hernia,  Mesh use, chronic pain,  Organ injury,  Bowel injury,  Bladder injury,   nerve injury with numbness around the incision,  Death,  and worsening of preexisting  medical problems.  The alternatives to surgery have been discussed as well..  Long term expectations of both operative and non operative treatments have been discussed.   The patient agrees to proceed.

## 2013-11-15 NOTE — Interval H&P Note (Signed)
History and Physical Interval Note:  11/15/2013 1:13 PM  Benjamin Salazar  has presented today for surgery, with the diagnosis of left inguinal hernia  The various methods of treatment have been discussed with the patient and family. After consideration of risks, benefits and other options for treatment, the patient has consented to  Procedure(s): REPAIR LEFT INGUINAL HERNIA  (Left) INSERTION OF MESH (Left) as a surgical intervention .  The patient's history has been reviewed, patient examined, no change in status, stable for surgery.  I have reviewed the patient's chart and labs.  Questions were answered to the patient's satisfaction.     Odeth Bry A.

## 2013-11-15 NOTE — Anesthesia Postprocedure Evaluation (Signed)
  Anesthesia Post-op Note  Patient: Benjamin Salazar  Procedure(s) Performed: Procedure(s): REPAIR LEFT INGUINAL HERNIA  (Left) INSERTION OF MESH (Left)  Patient Location: PACU  Anesthesia Type:GA combined with regional for post-op pain  Level of Consciousness: awake, alert  and oriented  Airway and Oxygen Therapy: Patient Spontanous Breathing  Post-op Pain: moderate  Post-op Assessment: Post-op Vital signs reviewed  Post-op Vital Signs: Reviewed  Last Vitals:  Filed Vitals:   11/15/13 1600  BP: 140/84  Pulse: 79  Temp:   Resp: 14    Complications: No apparent anesthesia complications

## 2013-11-15 NOTE — Anesthesia Procedure Notes (Addendum)
Anesthesia Regional Block:  TAP block  Pre-Anesthetic Checklist: ,, timeout performed, Correct Patient, Correct Site, Correct Laterality, Correct Procedure, Correct Position, site marked, Risks and benefits discussed,  Surgical consent,  Pre-op evaluation,  At surgeon's request and post-op pain management  Laterality: Left and Upper  Prep: chloraprep       Needles:  Injection technique: Single-shot  Needle Type: Echogenic Needle     Needle Length: 9cm 9 cm Needle Gauge: 21 and 21 G    Additional Needles:  Procedures: ultrasound guided (picture in chart) TAP block Narrative:  Start time: 11/15/2013 1:50 PM End time: 11/15/2013 1:58 PM Injection made incrementally with aspirations every 5 mL.  Performed by: Personally  Anesthesiologist: Sheldon Silvanavid Crews, MD   Procedure Name: LMA Insertion Date/Time: 11/15/2013 2:08 PM Performed by: Burna CashONRAD, Lynsie Mcwatters C Pre-anesthesia Checklist: Patient identified, Emergency Drugs available, Suction available and Patient being monitored Patient Re-evaluated:Patient Re-evaluated prior to inductionOxygen Delivery Method: Circle System Utilized Preoxygenation: Pre-oxygenation with 100% oxygen Intubation Type: IV induction Ventilation: Mask ventilation without difficulty LMA: LMA inserted LMA Size: 5.0 Number of attempts: 1 Airway Equipment and Method: bite block Placement Confirmation: positive ETCO2 Tube secured with: Tape Dental Injury: Teeth and Oropharynx as per pre-operative assessment

## 2013-11-15 NOTE — Op Note (Signed)
Left Inguinal Hernia, Open, Procedure Note with mesh  Indications: The patient presented with a history of a left, reducible inguinal hernia.The risk of hernia repair include bleeding,  Infection,   Recurrence of the hernia,  Mesh use, chronic pain,  Organ injury,  Bowel injury,  Bladder injury,   nerve injury with numbness around the incision,  Death,  and worsening of preexisting  medical problems.  The alternatives to surgery have been discussed as well..  Long term expectations of both operative and non operative treatments have been discussed.   The patient agrees to proceed. hernia.    Pre-operative Diagnosis: left reducible inguina hernia  Post-operative Diagnosis: same  Surgeon: Harriette BouillonORNETT,Lydie Stammen A.   Assistants: none  Anesthesia: General endotracheal anesthesia, Local anesthesia 0.25.% bupivacaine, with epinephrine and TEP block  ASA Class: 2  Procedure Details  The patient was seen again in the Holding Room. The risks, benefits, complications, treatment options, and expected outcomes were discussed with the patient. The possibilities of reaction to medication, pulmonary aspiration, perforation of viscus, bleeding, recurrent infection, the need for additional procedures, and development of a complication requiring transfusion or further operation were discussed with the patient and/or family. There was concurrence with the proposed plan, and informed consent was obtained. The site of surgery was properly noted/marked. The patient was taken to the Operating Room, identified as Benjamin Salazar, and the procedure verified as hernia repair. A Time Out was held and the above information confirmed.  The patient was placed in the supine position and underwent induction of anesthesia, the lower abdomen and groin was prepped and draped in the standard fashion, and 0.5% Marcaine with epinephrine was used to anesthetize the skin over the mid-portion of the inguinal canal. A transverse incision  was made. Dissection was carried through the soft tissue to expose the inguinal canal and inguinal ligament along its lower edge. The external oblique fascia was split along the course of its fibers, exposing the inguinal canal. The cord was  looped using a Penrose drain and reflected out of the field. The ilioinguinal nerve was divided to prevent entrapment.  The defect was exposed and a piece of prolene hernia system ultrapro mesh was and placed into  the indirect defect  After reducing the hernia sac off the cord. . Interupted 1-0 novafil suture was then used  to repair the defect, with the suture being sewn from the pubic tubercle inferiorly and superiorly along the canal to a level just beyond the internal ring. The mesh was split to allow passage of the cord and nerve into the canal without entrapment. The contents were then returned to canal and the external oblique fashion was then closed in a continuous fashion using 3-0 Vicryl suture taking care not to cause entrapment. Scarpa's layer closed with 3 0 vicryl and 4 0 monocryl used to close the skin.  Dermabond used for dressing.  Instrument, sponge, and needle counts were correct prior to closure and at the conclusion of the case.  Findings: Hernia as above  Estimated Blood Loss: Minimal         Drains: None         Total IV Fluids: 600 mL         Specimens: none               Complications: None; patient tolerated the procedure well.         Disposition: PACU - hemodynamically stable.         Condition: stable

## 2013-11-15 NOTE — Discharge Instructions (Signed)
[facility] _______[facility] ° °UMBILICAL OR INGUINAL HERNIA REPAIR: POST OP INSTRUCTIONS ° °Always review your discharge instruction sheet given to you by the facility where your surgery was performed. °IF YOU HAVE DISABILITY OR FAMILY LEAVE FORMS, YOU MUST BRING THEM TO THE OFFICE FOR PROCESSING.   °DO NOT GIVE THEM TO YOUR DOCTOR. ° °1. A  prescription for pain medication may be given to you upon discharge.  Take your pain medication as prescribed, if needed.  If narcotic pain medicine is not needed, then you may take acetaminophen (Tylenol) or ibuprofen (Advil) as needed. °2. Take your usually prescribed medications unless otherwise directed. °3. If you need a refill on your pain medication, please contact your pharmacy.  They will contact our office to request authorization. Prescriptions will not be filled after 5 pm or on week-ends. °4. You should follow a light diet the first 24 hours after arrival home, such as soup and crackers, etc.  Be sure to include lots of fluids daily.  Resume your normal diet the day after surgery. °5. Most patients will experience some swelling and bruising around the umbilicus or in the groin and scrotum.  Ice packs and reclining will help.  Swelling and bruising can take several days to resolve.  °6. It is common to experience some constipation if taking pain medication after surgery.  Increasing fluid intake and taking a stool softener (such as Colace) will usually help or prevent this problem from occurring.  A mild laxative (Milk of Magnesia or Miralax) should be taken according to package directions if there are no bowel movements after 48 hours. °7. Unless discharge instructions indicate otherwise, you may remove your bandages 24-48 hours after surgery, and you may shower at that time.  You may have steri-strips (small skin tapes) in place directly over the incision.  These strips should be left on the skin for 7-10 days.  If your surgeon used skin glue on the  incision, you may shower in 24 hours.  The glue will flake off over the next 2-3 weeks.  Any sutures or staples will be removed at the office during your follow-up visit. °8. ACTIVITIES:  You may resume regular (light) daily activities beginning the next day--such as daily self-care, walking, climbing stairs--gradually increasing activities as tolerated.  You may have sexual intercourse when it is comfortable.  Refrain from any heavy lifting or straining until approved by your doctor. °a. You may drive when you are no longer taking prescription pain medication, you can comfortably wear a seatbelt, and you can safely maneuver your car and apply brakes. °b. RETURN TO WORK:  __________________________________________________________ °9. You should see your doctor in the office for a follow-up appointment approximately 2-3 weeks after your surgery.  Make sure that you call for this appointment within a day or two after you arrive home to insure a convenient appointment time. °10. OTHER INSTRUCTIONS:  __________________________________________________________________________________________________________________________________________________________________________________________  °WHEN TO CALL YOUR DOCTOR: °1. Fever over 101.0 °2. Inability to urinate °3. Nausea and/or vomiting °4. Extreme swelling or bruising °5. Continued bleeding from incision. °6. Increased pain, redness, or drainage from the incision ° °The clinic staff is available to answer your questions during regular business hours.  Please don’t hesitate to call and ask to speak to one of the nurses for clinical concerns.  If you have a medical emergency, go to the nearest emergency room or call 911.  A surgeon from [facility] is always on call at the hospital ° ° °  [address], [address], [place], [place]  [address] ? ° [address] [address], [place], [place]   [address] °[phone] ? [phone] ? FAX [phone] °Web site:  [email or web address] ° ° ° °Post Anesthesia Home Care Instructions ° °Activity: °Get plenty of rest for the remainder of the day. A responsible adult should stay with you for 24 hours following the procedure.  °For the next 24 hours, DO NOT: °-Drive a car °-Operate machinery °-Drink alcoholic beverages °-Take any medication unless instructed by your physician °-Make any legal decisions or sign important papers. ° °Meals: °Start with liquid foods such as gelatin or soup. Progress to regular foods as tolerated. Avoid greasy, spicy, heavy foods. If nausea and/or vomiting occur, drink only clear liquids until the nausea and/or vomiting subsides. Call your physician if vomiting continues. ° °Special Instructions/Symptoms: °Your throat may feel dry or sore from the anesthesia or the breathing tube placed in your throat during surgery. If this causes discomfort, gargle with warm salt water. The discomfort should disappear within 24 hours. ° °

## 2013-11-16 ENCOUNTER — Telehealth (INDEPENDENT_AMBULATORY_CARE_PROVIDER_SITE_OTHER): Payer: Self-pay | Admitting: General Surgery

## 2013-11-16 NOTE — Telephone Encounter (Signed)
Pt called with poor pain control 1 day after repair.  Told to double up on percocet every 4 hours and add ibuprofen 800 mg TID  For 48 hours and apply ice pack and limit activity.

## 2013-11-19 ENCOUNTER — Encounter (HOSPITAL_BASED_OUTPATIENT_CLINIC_OR_DEPARTMENT_OTHER): Payer: Self-pay | Admitting: Surgery

## 2013-11-19 LAB — POCT HEMOGLOBIN-HEMACUE: Hemoglobin: 15 g/dL (ref 13.0–17.0)

## 2013-11-27 ENCOUNTER — Telehealth (INDEPENDENT_AMBULATORY_CARE_PROVIDER_SITE_OTHER): Payer: Self-pay

## 2013-11-27 NOTE — Telephone Encounter (Signed)
Pt called stating he has increasing testicular pain. Pt states level 8. No redness. Minimal swelling. Pt request to be seen asap. Offered appt with Dr Luisa Hartornett today but pt declined due to no transportation. Appt made for tomorrow with urg office. Pt advised if testicle becomes cold to touch or dark he is to go to ER asap to have evalulated.

## 2013-11-28 ENCOUNTER — Encounter (INDEPENDENT_AMBULATORY_CARE_PROVIDER_SITE_OTHER): Payer: Self-pay | Admitting: Surgery

## 2013-11-28 ENCOUNTER — Ambulatory Visit (INDEPENDENT_AMBULATORY_CARE_PROVIDER_SITE_OTHER): Payer: Medicare Other | Admitting: Surgery

## 2013-11-28 VITALS — BP 118/74 | HR 61 | Temp 98.4°F | Ht 72.0 in | Wt 131.0 lb

## 2013-11-28 DIAGNOSIS — Z9889 Other specified postprocedural states: Principal | ICD-10-CM

## 2013-11-28 DIAGNOSIS — Z8719 Personal history of other diseases of the digestive system: Secondary | ICD-10-CM

## 2013-11-28 MED ORDER — OXYCODONE-ACETAMINOPHEN 10-325 MG PO TABS: 1.0000 | ORAL_TABLET | ORAL | Status: DC | PRN
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
## 2013-12-03 ENCOUNTER — Encounter (INDEPENDENT_AMBULATORY_CARE_PROVIDER_SITE_OTHER): Payer: Medicare Other | Admitting: Surgery

## 2013-12-06 ENCOUNTER — Ambulatory Visit (INDEPENDENT_AMBULATORY_CARE_PROVIDER_SITE_OTHER): Payer: Medicare Other | Admitting: Surgery

## 2013-12-06 ENCOUNTER — Encounter (INDEPENDENT_AMBULATORY_CARE_PROVIDER_SITE_OTHER): Payer: Self-pay | Admitting: Surgery

## 2013-12-06 VITALS — BP 124/70 | HR 77 | Temp 97.5°F | Ht 71.0 in | Wt 127.0 lb

## 2013-12-06 DIAGNOSIS — Z8719 Personal history of other diseases of the digestive system: Secondary | ICD-10-CM

## 2013-12-06 DIAGNOSIS — Z9889 Other specified postprocedural states: Secondary | ICD-10-CM

## 2013-12-06 MED ORDER — OXYCODONE-ACETAMINOPHEN 10-325 MG PO TABS: 1.0000 | ORAL_TABLET | ORAL | Status: AC | PRN

## 2013-12-06 NOTE — Progress Notes (Signed)
Pt returns today after left inguinal  hernia repair 3 weeks ago.  Pain is moderately controlled.  Bowels are functioning.  Wound is clean.  On exam:  Incision is clean /dry/intact.  Area is soft without signs of hernia recurrence. Testicle normal bilaterally  Impression:  Status repair of hernia left inguinal with post op pain issues slowly improving without complicating features  Plan:  RTC PRN  Return to work in    2  Weeks. Refill pain meds today.  Recommend ice packs when resting and ibuprofen 600 mg po tid prn

## 2013-12-06 NOTE — Patient Instructions (Signed)
Ice packs to groin when resting. Return to full duty in 2 weeks.  Start slow

## 2016-02-20 IMAGING — CT CT NECK W/ CM
4 of 7 series · 14 of 33 positions shown, 17 images · IV contrast (READICAT/WATER & [ID] OMNI 300)
Comparison: None.

CLINICAL DATA: Lymphadenopathy, suspect right cervical adenopathy
for three days, area marked with vitamin E capsule; left inguinal
pain for 3 weeks and 18 pound weight loss in last month, with
decreased appetite, nausea, and diarrhea, quit smoking three weeks
ago

EXAM:
CT NECK WITH CONTRAST:  CT ABDOMEN AND PELVIS WITH CONTRAST
TECHNIQUE: Multidetector CT imaging of the neck, abdomen, and pelvis was
performed using the standard protocol following the bolus
administration of intravenous contrast.
CONTRAST:  100mL OMNIPAQUE IOHEXOL 300 MG/ML  SOLN

[Series 2: a&p & neck w/ · axial · 0.70mm/px · z∈[-621,-82]mm · 5 of 188 slices shown, 7 images]
[im 32/188  soft-tissue]
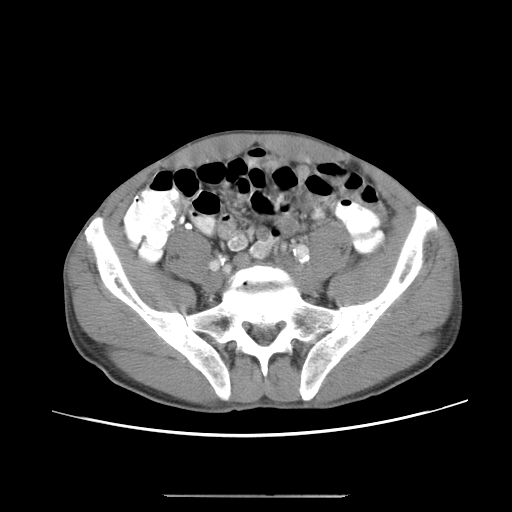
[im 32/188  bone]
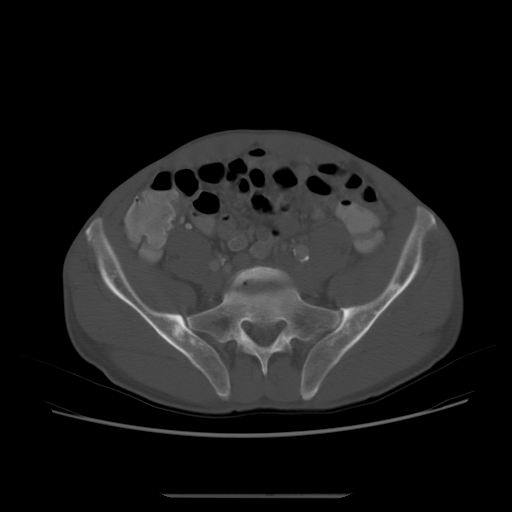
[im 63/188  bone]
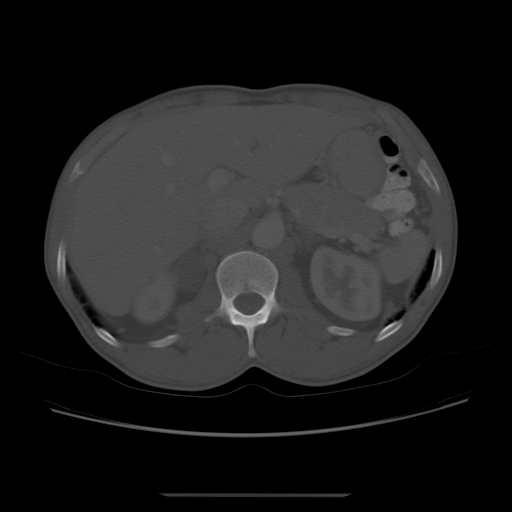
[im 94/188  bone]
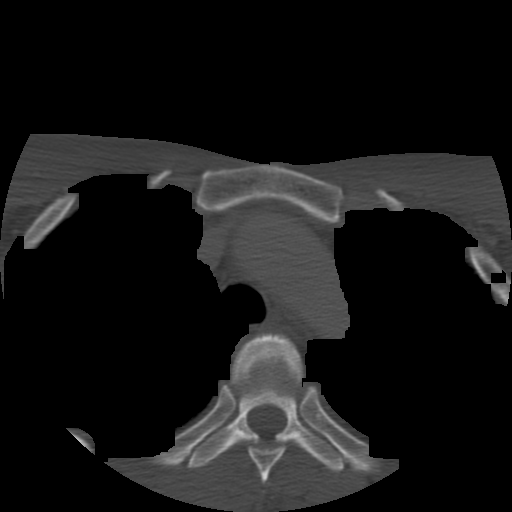
[im 125/188  bone]
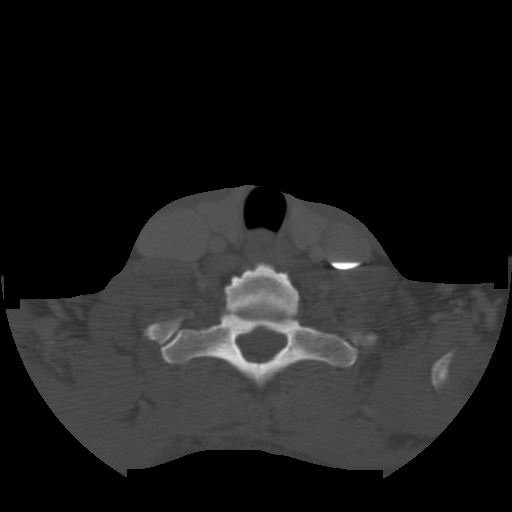
[im 156/188  soft-tissue]
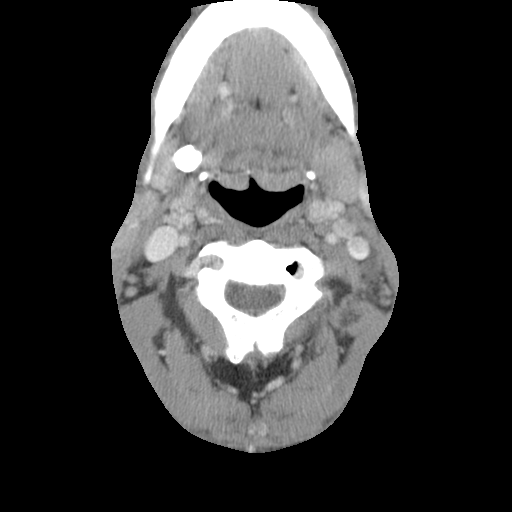
[im 156/188  bone]
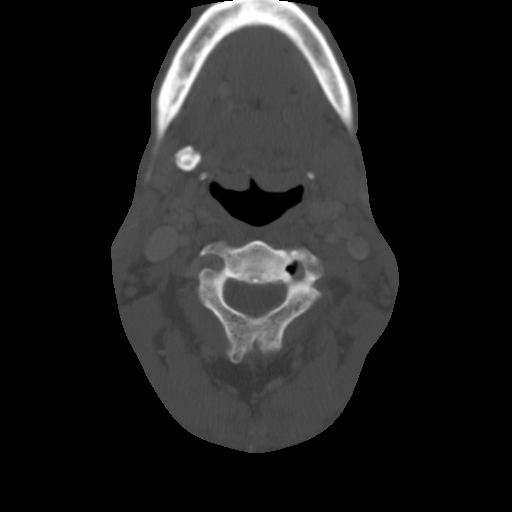

[Series 102: sag a&p · sagittal · 0.85mm/px · 5 of 149 slices shown, 6 images]
[im 50/149  bone]
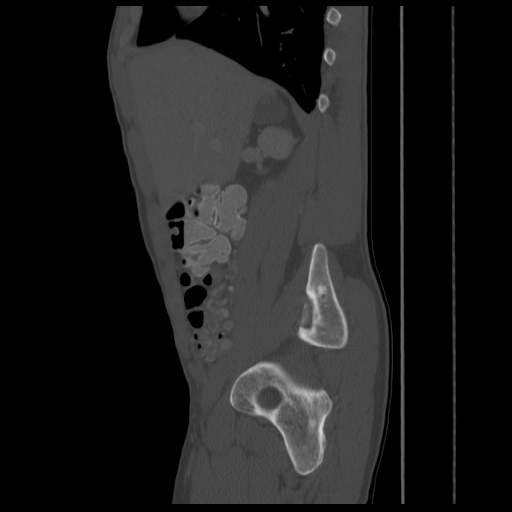
[im 62/149  bone]
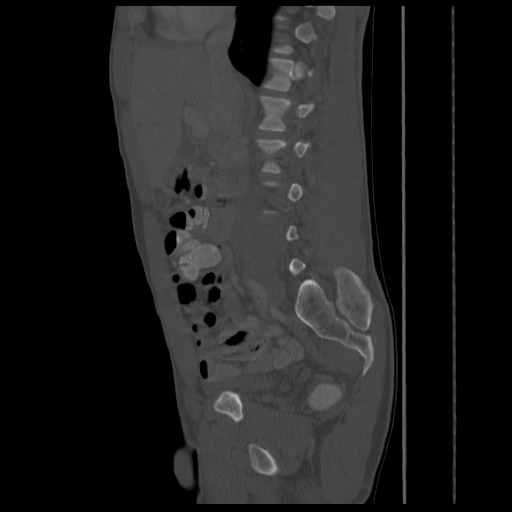
[im 75/149  soft-tissue]
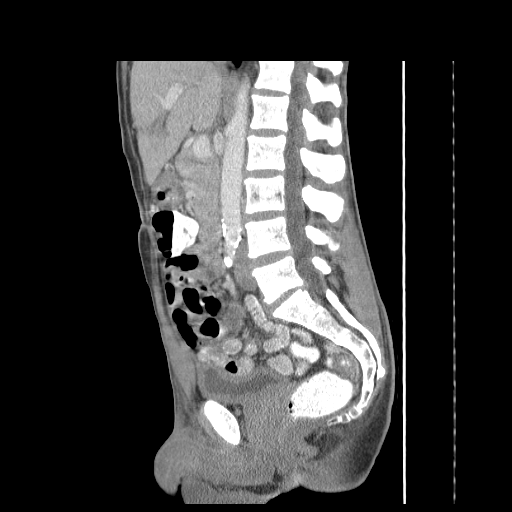
[im 75/149  bone]
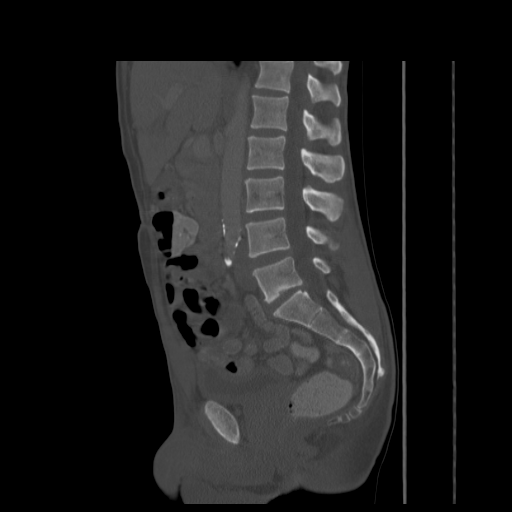
[im 87/149  bone]
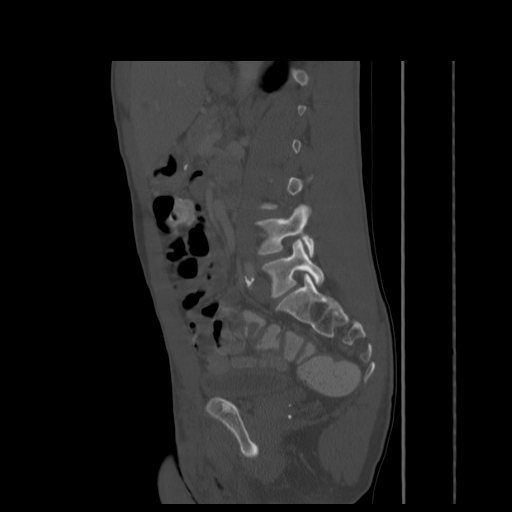
[im 99/149  bone]
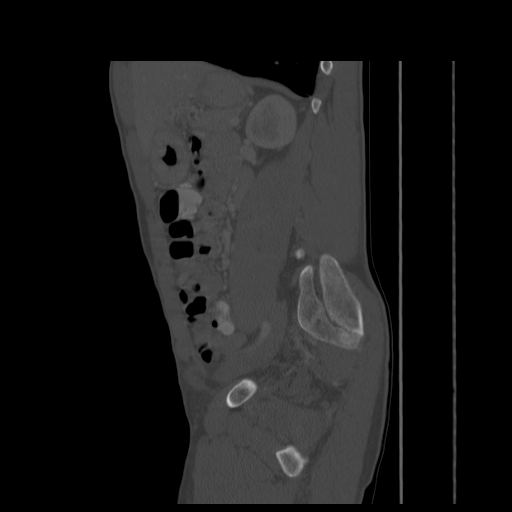

[Series 103: cor neck · coronal · 0.53mm/px · 1 of 92 slices shown]
[im 46/92  bone]
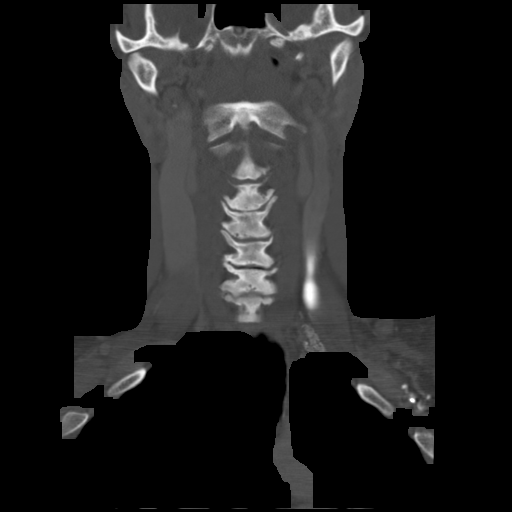

[Series 105: axial neck · axial · 0.39mm/px · z∈[-228,-88]mm · 3 of 145 slices shown]
[im 37/145  bone]
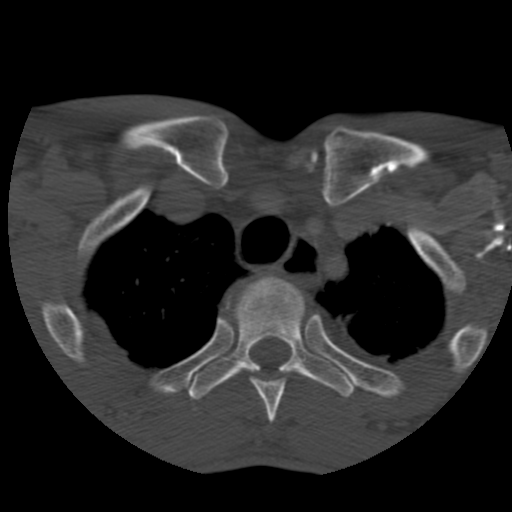
[im 73/145  bone]
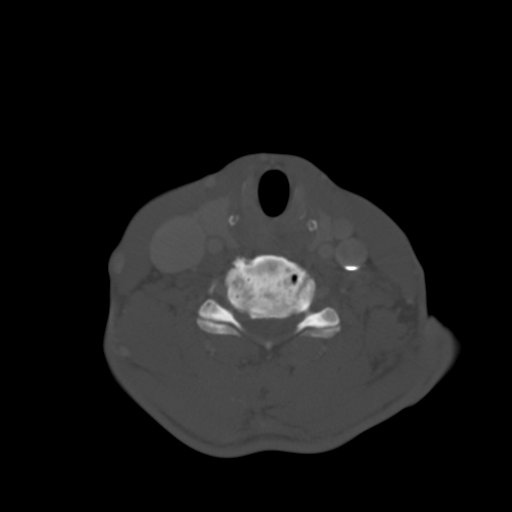
[im 109/145  bone]
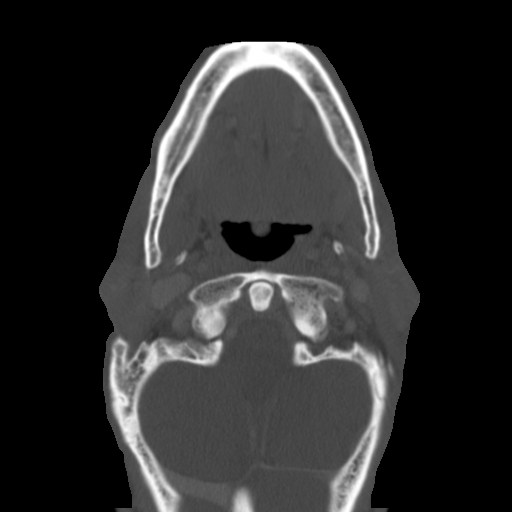

[14 of 33 positions shown; findings below may reference images not displayed]

FINDINGS: NECK:

The area marked in the right neck corresponds to the right
submandibular gland. It is not enlarged. It measures in
cross-section 14 x 26 mm, as compared to 19 x 26 mm for the
contralateral submandibular gland. There are numerous small
bilateral carotid chain and submandibular lymph nodes none of which
are enlarged. There are also posterior triangle lymph nodes,
bilaterally, none of which are enlarged.

There are no mucosal lesions. Focal cords appear normal. Epiglottis
is normal. Bilateral parotid glands are normal.

There are no acute musculoskeletal findings. There is significant
degenerative disc disease throughout the cervical spine. There is no
supraclavicular adenopathy. Scans through the lung apices show mild
emphysematous change.

ABD/PELVIS:

The visualized portions of the lung bases are clear.

There are numerous round low-attenuation lesions throughout the
liver, the largest is in the left lobe measuring 7 mm. These are too
small to characterize but appear most consistent with cysts. The
spleen and left adrenal gland are normal. The right adrenal gland
demonstrates a mass, measuring 35 by 26 mm within average
attenuation value of 20.

Seen in image number 27, there is an ovoid area of mildly decreased
attenuation in the pancreatic head measuring 12 mm. Pancreatic duct
is mildly distended in this vicinity. Gallbladder is normal. Kidneys
are normal.

There is minimal calcification of the abdominal aorta without
dilatation. Bowel is normal. Appendix is normal.

There is mild diffuse bladder wall thickening. Reproductive organs
appear normal. There is no free fluid. There is no significant
retroperitoneal or mesenteric adenopathy.

There is no significant inguinal adenopathy on either side. There is
no evidence of inguinal hernia. There are no soft tissue or vascular
abnormalities in either inguinal region, other than mild femoral
artery calcification bilaterally.

There are no acute or significant focal musculoskeletal
abnormalities.
IMPRESSION: 1. No significant cervical adenopathy
2. Small nodular area of mild lead altered attenuation in the
pancreatic head. Mass is not excluded. Pancreatic MRI suggested.
3. Right adrenal mass. It demonstrates generally benign
characteristics but is indeterminate. In the absence of history of
malignancy, 12 month followup CT or MRI would be suggested. If
malignancy is suspected or documented however, consider proceeding
to evaluate further with MR adrenal protocol. This recommendation
follows ACR consensus guidelines: Managing Incidental Findings on
Abdominal CT: White Paper of the ACR Incidental Findings Committee.
[HOSPITAL] 6606;[DATE]
4. Mild diffuse bladder wall thickening significance uncertain. This
may be exaggerated by relatively decompressed state of the bladder.
Other possibilities include muscular hypertrophy or cystitis.
5. No significant inguinal adenopathy or other abnormality.
6. Other nonacute abnormalities as described above

## 2021-02-23 ENCOUNTER — Encounter (HOSPITAL_BASED_OUTPATIENT_CLINIC_OR_DEPARTMENT_OTHER): Payer: Self-pay

## 2021-02-23 ENCOUNTER — Emergency Department (HOSPITAL_BASED_OUTPATIENT_CLINIC_OR_DEPARTMENT_OTHER)
Admission: EM | Admit: 2021-02-23 | Discharge: 2021-02-23 | Disposition: A | Discharge status: Home / Self Care | Payer: Medicare Other | Attending: Emergency Medicine | Admitting: Emergency Medicine

## 2021-02-23 ENCOUNTER — Other Ambulatory Visit: Payer: Self-pay

## 2021-02-23 ENCOUNTER — Emergency Department (HOSPITAL_BASED_OUTPATIENT_CLINIC_OR_DEPARTMENT_OTHER): Payer: Medicare Other

## 2021-02-23 DIAGNOSIS — Z87891 Personal history of nicotine dependence: Secondary | ICD-10-CM | POA: Diagnosis not present

## 2021-02-23 DIAGNOSIS — W01198A Fall on same level from slipping, tripping and stumbling with subsequent striking against other object, initial encounter: Secondary | ICD-10-CM | POA: Insufficient documentation

## 2021-02-23 DIAGNOSIS — M545 Low back pain, unspecified: Secondary | ICD-10-CM | POA: Insufficient documentation

## 2021-02-23 DIAGNOSIS — M542 Cervicalgia: Secondary | ICD-10-CM | POA: Insufficient documentation

## 2021-02-23 DIAGNOSIS — R42 Dizziness and giddiness: Secondary | ICD-10-CM | POA: Diagnosis not present

## 2021-02-23 DIAGNOSIS — S0990XA Unspecified injury of head, initial encounter: Secondary | ICD-10-CM | POA: Diagnosis not present

## 2021-02-23 DIAGNOSIS — W19XXXA Unspecified fall, initial encounter: Secondary | ICD-10-CM

## 2021-02-23 MED ORDER — OXYCODONE-ACETAMINOPHEN 5-325 MG PO TABS
1.0000 | ORAL_TABLET | Freq: Once | ORAL | Status: AC
  Administered 2021-02-23: 1 via ORAL
  Filled 2021-02-23: qty 1

## 2021-02-23 NOTE — ED Notes (Signed)
C-collar placed in triage remains on.
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
## 2021-02-23 NOTE — ED Triage Notes (Signed)
Pt states he was reaching up-feels he became dizzy (history of vertigo with same action) fell backwards-fell backwards on the floor-struck "butt bone on the floor and my head hit the microwave cart" with +LOC-pain to neck, back of head, tailbone and lower back-NAD-to triage in w/c

## 2021-02-23 NOTE — ED Notes (Signed)
Patient transported to Radiology 

## 2021-02-23 NOTE — Discharge Instructions (Addendum)
Your x-rays and scans did not show any new fracture or dislocation..  Continue taking your pain medications at home.  Follow-up with your doctor within the week.  Return back to the ER if you have fevers worsening symptoms or any additional concerns.

## 2021-10-24 ENCOUNTER — Other Ambulatory Visit: Payer: Self-pay

## 2021-10-24 ENCOUNTER — Encounter (HOSPITAL_BASED_OUTPATIENT_CLINIC_OR_DEPARTMENT_OTHER): Payer: Self-pay | Admitting: Emergency Medicine

## 2021-10-24 ENCOUNTER — Emergency Department (HOSPITAL_BASED_OUTPATIENT_CLINIC_OR_DEPARTMENT_OTHER): Payer: Medicare Other

## 2021-10-24 ENCOUNTER — Emergency Department (HOSPITAL_BASED_OUTPATIENT_CLINIC_OR_DEPARTMENT_OTHER)
Admission: EM | Admit: 2021-10-24 | Discharge: 2021-10-24 | Disposition: A | Discharge status: Home / Self Care | Payer: Medicare Other | Attending: Emergency Medicine | Admitting: Emergency Medicine

## 2021-10-24 DIAGNOSIS — Y92009 Unspecified place in unspecified non-institutional (private) residence as the place of occurrence of the external cause: Secondary | ICD-10-CM | POA: Insufficient documentation

## 2021-10-24 DIAGNOSIS — S93402A Sprain of unspecified ligament of left ankle, initial encounter: Secondary | ICD-10-CM | POA: Insufficient documentation

## 2021-10-24 DIAGNOSIS — S99912A Unspecified injury of left ankle, initial encounter: Secondary | ICD-10-CM | POA: Diagnosis present

## 2021-10-24 DIAGNOSIS — W01198A Fall on same level from slipping, tripping and stumbling with subsequent striking against other object, initial encounter: Secondary | ICD-10-CM | POA: Diagnosis not present

## 2021-10-24 MED ORDER — NAPROXEN 375 MG PO TABS: 375.0000 mg | ORAL_TABLET | Freq: Two times a day (BID) | ORAL | 0 refills | Status: AC

## 2021-10-24 NOTE — Discharge Instructions (Addendum)
Your x-rays today did not show any evidence of fracture.  Your exam was reassuring.  I will send naproxen into the pharmacy for you for you to take to help with the inflammation.  We have given you a walking boot.  You can bear weight as tolerated.  I have also given you a referral to an orthopedist upstairs named Dr. Jordan Likes.  You can call and schedule an appointment as you need to.  He stated you have a history of vertigo and you have had these types of symptoms in the past.  He states he hit your head on the way down.  You are unsure if he had loss of consciousness.  We discussed doing a CT scan however you defer this at this time and state you will come back if you have any concerning symptoms.

## 2021-10-24 NOTE — ED Notes (Signed)
Patient refused d/c vitals.

## 2021-10-24 NOTE — ED Notes (Addendum)
Patient able to ambulate appropriately with CAM boot and walker.

## 2021-10-24 NOTE — ED Notes (Signed)
Ice packs placed on bilateral ankles and feet elevated.

## 2021-10-24 NOTE — ED Provider Notes (Signed)
[facility] EMERGENCY DEPARTMENT Provider Note   CSN: [id number] Arrival date & time: 10/24/21  2020     History  Chief Complaint  Patient presents with   Ankle Pain    Benjamin Salazar is a 61 y.o. male.  61 year old male presents today for evaluation of bilateral ankle pain.  Patient states he was at home had a bottle of beer that he was drinking.  States he had a couple sips at this point.  States he had sudden onset of dizziness causing him to fall.  States he has history of vertigo and the symptoms are consistent with that.  Patient states he hit his head on a door prior to falling.  He states he twisted his ankles on his way down.  Denies any other injuries.  Denies loss of consciousness.  Currently he is without dizziness or other complaints.  States pain is worse on the left side compared to right.  Denies any chest pain, shortness of breath, or palpitations prior to his episode of falling down.  The history is provided by the patient. No language interpreter was used.       Home Medications Prior to Admission medications   Medication Sig Start Date End Date Taking? Authorizing Provider  busPIRone (BUSPAR) 15 MG tablet Take 15 mg by mouth 2 (two) times daily.    [provider]  divalproex (DEPAKOTE) 500 MG DR tablet Take 500 mg by mouth 2 (two) times daily.    [provider]  LORazepam (ATIVAN) 1 MG tablet Take 1 mg by mouth every 8 (eight) hours.    [provider]  oxyCODONE-acetaminophen (PERCOCET) 10-325 MG per tablet Take 1 tablet by mouth every 4 (four) hours as needed for pain. 12/06/13   Cornett, Maisie Fus, MD  perphenazine (TRILAFON) 4 MG tablet Take 4 mg by mouth 2 (two) times daily.    [provider]  sertraline (ZOLOFT) 50 MG tablet Take 50 mg by mouth daily.    [provider]      Allergies    Haldol [haloperidol] and Thorazine [chlorpromazine]    Review of Systems   Review of Systems  Constitutional:   Negative for fever.  Respiratory:  Negative for shortness of breath.   Cardiovascular:  Negative for chest pain.  Musculoskeletal:  Positive for arthralgias. Negative for joint swelling.  Neurological:  Positive for dizziness (Now resolved). Negative for syncope, light-headedness and headaches.  All other systems reviewed and are negative.   Physical Exam Updated Vital Signs BP 110/82 (BP Location: Left Arm)   Pulse 84   Temp 98.9 F (37.2 C) (Oral)   Resp 18   Ht 6' (1.829 m)   Wt 68 kg   SpO2 97%   BMI 20.34 kg/m  Physical Exam Vitals and nursing note reviewed.  Constitutional:      General: He is not in acute distress.    Appearance: Normal appearance. He is not ill-appearing.  HENT:     Head: Normocephalic and atraumatic.     Nose: Nose normal.  Eyes:     General: No scleral icterus.    Extraocular Movements: Extraocular movements intact.     Conjunctiva/sclera: Conjunctivae normal.  Cardiovascular:     Rate and Rhythm: Normal rate and regular rhythm.     Pulses: Normal pulses.     Heart sounds: Normal heart sounds.  Pulmonary:     Effort: Pulmonary effort is normal. No respiratory distress.     Breath sounds: Normal breath  sounds. No wheezing or rales.  Abdominal:     General: There is no distension.     Tenderness: There is no abdominal tenderness.  Musculoskeletal:        General: Normal range of motion.     Cervical back: Normal range of motion.  Skin:    General: Skin is warm and dry.  Neurological:     General: No focal deficit present.     Mental Status: He is alert. Mental status is at baseline.     ED Results / Procedures / Treatments   Labs (all labs ordered are listed, but only abnormal results are displayed) Labs Reviewed - No data to display  EKG None  Radiology DG Ankle Complete Right  Result Date: 10/24/2021 CLINICAL DATA:  Fall EXAM: RIGHT ANKLE - COMPLETE 3+ VIEW COMPARISON:  None Available. FINDINGS: There is no evidence of  fracture, dislocation, or joint effusion. There is no evidence of arthropathy or other focal bone abnormality. Soft tissues are unremarkable. IMPRESSION: Negative. Electronically Signed   By: Deatra Robinson M.D.   On: 10/24/2021 21:01   DG Ankle Complete Left  Result Date: 10/24/2021 CLINICAL DATA:  Fall EXAM: LEFT ANKLE COMPLETE - 3+ VIEW COMPARISON:  None Available. FINDINGS: There is no evidence of fracture, dislocation, or joint effusion. There is no evidence of arthropathy or other focal bone abnormality. Soft tissues are unremarkable. IMPRESSION: Negative. Electronically Signed   By: Deatra Robinson M.D.   On: 10/24/2021 21:00    Procedures Procedures    Medications Ordered in ED Medications - No data to display  ED Course/ Medical Decision Making/ A&P                           Medical Decision Making Amount and/or Complexity of Data Reviewed Radiology: ordered.   61 year old male presents today for evaluation of bilateral ankle pain following a fall that occurred at his house.  States he got dizzy and fell.  He has history of vertigo.  States this episode was similar.  He did have a beer in his hand but states he only had a few sips.  Hit his head on a door before falling.  Denies any loss of consciousness.  He is not on blood thinners.  He stated he had a period of confusion right afterwards where he took, second to get his bearings.  We discussed doing a CT head however both patient and his sister defer this and will return for any worsening or concerning symptoms.  Endorses pain to bilateral ankles.  X-rays reassuring without bony injury or soft tissue swelling.  No swelling on exam.  Pain worse on the left.  Cam boot provided.  Patient refuses to apply pressure until he gets a walker.  Walker provided.  Patient was able to ambulate.  Return precautions discussed.  Patient voices understanding and is in agreement with plan.   Final Clinical Impression(s) / ED Diagnoses Final  diagnoses:  Sprain of left ankle, unspecified ligament, initial encounter    Rx / DC Orders ED Discharge Orders          Ordered    naproxen (NAPROSYN) 375 MG tablet  2 times daily        10/24/21 2238              Marita Kansas, PA-C 10/24/21 2248    Terald Sleeper, MD 10/25/21 1119

## 2021-10-24 NOTE — ED Triage Notes (Signed)
Reports a hx of vertigo.  Had a beer today then got up dizzy and fell causing him to twist his ankles.  C/o bil ankle pain.  Left worse than right.
